# Patient Record
Sex: Male | Born: 2000
Health system: Southern US, Community
[De-identification: ages and names within clinical notes are randomized; demographics above are authoritative.]

---
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
## 2024-04-16 ENCOUNTER — Encounter: Payer: Self-pay | Admitting: Nurse Practitioner

## 2024-04-16 ENCOUNTER — Ambulatory Visit: Payer: Self-pay | Admitting: Nurse Practitioner

## 2024-04-16 VITALS — BP 118/70 | HR 46 | Temp 98.4°F | Ht 72.0 in | Wt 187.0 lb

## 2024-04-16 DIAGNOSIS — H5213 Myopia, bilateral: Secondary | ICD-10-CM | POA: Insufficient documentation

## 2024-04-16 DIAGNOSIS — F32A Depression, unspecified: Secondary | ICD-10-CM | POA: Insufficient documentation

## 2024-04-16 DIAGNOSIS — Z139 Encounter for screening, unspecified: Secondary | ICD-10-CM

## 2024-04-16 DIAGNOSIS — Z7689 Persons encountering health services in other specified circumstances: Secondary | ICD-10-CM

## 2024-04-16 DIAGNOSIS — Z2821 Immunization not carried out because of patient refusal: Secondary | ICD-10-CM | POA: Insufficient documentation

## 2024-04-16 DIAGNOSIS — R6882 Decreased libido: Secondary | ICD-10-CM | POA: Insufficient documentation

## 2024-04-16 DIAGNOSIS — H40053 Ocular hypertension, bilateral: Secondary | ICD-10-CM | POA: Insufficient documentation

## 2024-04-16 DIAGNOSIS — R001 Bradycardia, unspecified: Secondary | ICD-10-CM

## 2024-04-16 DIAGNOSIS — F419 Anxiety disorder, unspecified: Secondary | ICD-10-CM

## 2024-04-16 NOTE — Progress Notes (Addendum)
 LILLETTE Kristeen JINNY Gladis, CMA,acting as a neurosurgeon for Gaines Ada, FNP.,have documented all relevant documentation on the behalf of Gaines Ada, FNP,as directed by  Gaines Ada, FNP while in the presence of Gaines Ada, FNP.  Subjective:  Patient ID: Corey Torres , male    DOB: 01/10/2001 , 23 y.o.   MRN: [id number]  Chief Complaint  Patient presents with   Establish Care    Patient presents today to establish care, Patient reports compliance with medication. Patient denies any chest pain, SOB, or headaches. Patient would like his testosterone  checked, patient reports for the past 10 months he has had low libido. 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  Patient has been married 4 years, he does have a baby on the way.    Anxiety    Patient reports he feels like he struggles with anxiety, he reports he was previously doing better help but got to expensive. He is open to seeing a therapist he prefers video calls.     HPI Discussed the use of AI scribe software for clinical note transcription with the patient, who gave verbal consent to proceed.  History of Present Illness Corey Torres is a 23 year old male who presents with decreased libido.  He has experienced a progressive decline in libido over the past year, with a decrease in sexual activity from three times a week to once a month. He lacks sexual urge and interest, which he finds concerning. 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 No erectile dysfunction.  He has a history of anxiety, for which he attended therapy sessions post-deployment but discontinued due to cost. He has not taken any medications for anxiety. He reports a history of exposure to pornography from a young age, which he believes may have impacted his sexual function. He has been attempting to reduce his consumption of pornography over the past five  years.  His past medical history includes elevated blood pressure during his late teens and early twenties, and a diagnosis of ocular hypertension about a year ago. He reports a family history of high blood pressure and bipolar disorder in his father, and bipolar disorder and addiction issues in his sister.  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 He has reduced his alcohol consumption significantly over the past year, now drinking about five beers a week. He recently got out of the [employer] 3 weeks ago. He is planning to use the [facility] in the future for some of his disability claims. He is married. He has one child on the way. He denies smoking. No dizziness or lightheadedness.     History reviewed. No pertinent past medical history.   Family History  Problem Relation Age of Onset   Hypertension Father    Diabetes Maternal Aunt    Hypertension Paternal Grandfather     No current outpatient medications on file.   Allergies  Allergen Reactions   Penicillins Anaphylaxis   Penicillins      Review of Systems  Constitutional: Negative.   HENT: Negative.    Eyes: Negative.   Respiratory: Negative.    Cardiovascular: Negative.   Gastrointestinal: Negative.   Endocrine: Negative.   Genitourinary: Negative.   Musculoskeletal: Negative.   Allergic/Immunologic: Negative.   Neurological: Negative.   Hematological: Negative.   Psychiatric/Behavioral: Negative.       Today's Vitals   04/16/24 9070  BP: 118/70  Pulse: (!) 46  Temp: 98.4 F (36.9 C)  TempSrc: Oral  Weight: 187 lb (84.8 kg)  Height: 6' (1.829 m)  PainSc: 0-No pain   Body mass index is 25.36 kg/m.  Wt Readings from Last 3 Encounters:  04/16/24 187 lb (84.8 kg)     Objective:  Physical Exam Vitals and nursing note reviewed.  Constitutional:      General: He is not in acute distress.    Appearance: Normal appearance. He is normal weight.  HENT:     Head: Normocephalic.   Cardiovascular:     Rate and Rhythm: Normal rate and regular rhythm.     Pulses: Normal pulses.     Heart sounds: Normal heart sounds. No murmur heard. Pulmonary:     Effort: Pulmonary effort is normal. No respiratory distress.     Breath sounds: Normal breath sounds. No wheezing.  Musculoskeletal:        General: Normal range of motion.  Skin:    General: Skin is warm and dry.     Capillary Refill: Capillary refill takes less than 2 seconds.  Neurological:     General: No focal deficit present.     Mental Status: He is alert and oriented to person, place, and time.  Psychiatric:        Mood and Affect: Mood normal.        Behavior: Behavior normal.        Thought Content: Thought content normal.        Judgment: Judgment normal.        04/16/2024   10:02 AM  GAD 7 : Generalized Anxiety Score  Nervous, Anxious, on Edge 1  Control/stop worrying 0  Worry too much - different things 1  Trouble relaxing 0  Restless 0  Easily annoyed or irritable 2  Afraid - awful might happen 0  Total GAD 7 Score 4  Anxiety Difficulty Somewhat difficult       04/16/2024   10:02 AM  Depression screen PHQ 2/9  Decreased Interest 0  Down, Depressed, Hopeless 0  PHQ - 2 Score 0  Altered sleeping 1  Tired, decreased energy 1  Change in appetite 0  Feeling bad or failure about yourself  0  Trouble concentrating 2  Moving slowly or fidgety/restless 1  Suicidal thoughts 0  PHQ-9 Score 5  Difficult doing work/chores Not difficult at all    Assessment And Plan:  Establishing care with new doctor, encounter for  Low libido Assessment & Plan: Progressive decline likely due to stress and depression post-deployment. Consider hormonal imbalances, thyroid  dysfunction, or psychological factors. Testosterone  therapy risks discussed due to potential infertility. - Order testosterone  level. - Order TSH. - Refer to integrative behavioral health for therapy and counseling.  Orders: -      Testosterone ,Free and Total -     TSH -     CMP14+EGFR -     CBC -     Amb ref to [facility]  COVID-19 vaccination declined  Encounter for screening -     Hepatitis C antibody -     Hepatitis B surface antibody,qualitative  Anxiety and depression Assessment & Plan: Mild symptoms, previously discontinued therapy due to cost. Therapy emphasized over medication to address underlying issues, including potential pornography addiction. - Refer to integrative behavioral health for therapy and counseling. - anxiety score is 4 and depression score 5   Orders: -     Amb ref to [facility]  Bilateral ocular hypertension  Bradycardia Assessment & Plan: Heart rate at 46 bpm, possibly normal for athletic individuals. No dizziness or lightheadedness reported. - Order EKG Sinus bradycardia at 43. - Advise to seek emergency care if experiencing dizziness, lightheadedness, or feeling faint.  Orders: -     EKG 12-Lead     Return in about 5 months (around 09/16/2024) for phy when able.  Patient was given opportunity to ask questions. Patient verbalized understanding of the plan and was able to repeat key elements of the plan. All questions were answered to their satisfaction.   Patient and/or legal guardian verbally consented to [facility] services about presenting concerns and psychiatric consultation as appropriate.  The services will be billed as appropriate for the patient   I, Gaines Ada, FNP, have reviewed all documentation for this visit. The documentation on 04/16/24 for the exam, diagnosis, procedures, and orders are all accurate and complete.   IF YOU HAVE BEEN REFERRED TO A SPECIALIST, IT MAY TAKE 1-2 WEEKS TO SCHEDULE/PROCESS THE REFERRAL. IF YOU HAVE NOT HEARD FROM US /SPECIALIST IN TWO WEEKS, PLEASE GIVE US  A CALL AT [phone].

## 2024-04-22 LAB — TESTOSTERONE,FREE AND TOTAL
Testosterone, Free: 14.4 pg/mL (ref 9.3–26.5)
Testosterone: 1005 ng/dL — ABNORMAL HIGH (ref 264–916)

## 2024-04-22 LAB — HEPATITIS C ANTIBODY: Hep C Virus Ab: NONREACTIVE

## 2024-04-22 LAB — CBC
Hematocrit: 43.9 % (ref 37.5–51.0)
Hemoglobin: 14.3 g/dL (ref 13.0–17.7)
MCH: 29.4 pg (ref 26.6–33.0)
MCHC: 32.6 g/dL (ref 31.5–35.7)
MCV: 90 fL (ref 79–97)
Platelets: 222 x10E3/uL (ref 150–450)
RBC: 4.86 x10E6/uL (ref 4.14–5.80)
RDW: 12.4 % (ref 11.6–15.4)
WBC: 5.2 x10E3/uL (ref 3.4–10.8)

## 2024-04-22 LAB — CMP14+EGFR
ALT: 30 IU/L (ref 0–44)
AST: 26 IU/L (ref 0–40)
Albumin: 5 g/dL (ref 4.3–5.2)
Alkaline Phosphatase: 83 IU/L (ref 44–121)
BUN/Creatinine Ratio: 15 (ref 9–20)
BUN: 16 mg/dL (ref 6–20)
Bilirubin Total: 0.7 mg/dL (ref 0.0–1.2)
CO2: 21 mmol/L (ref 20–29)
Calcium: 9.9 mg/dL (ref 8.7–10.2)
Chloride: 104 mmol/L (ref 96–106)
Creatinine, Ser: 1.07 mg/dL (ref 0.76–1.27)
Globulin, Total: 2.3 g/dL (ref 1.5–4.5)
Glucose: 79 mg/dL (ref 70–99)
Potassium: 4.6 mmol/L (ref 3.5–5.2)
Sodium: 141 mmol/L (ref 134–144)
Total Protein: 7.3 g/dL (ref 6.0–8.5)
eGFR: 100 mL/min/1.73 (ref >59)

## 2024-04-22 LAB — TSH: TSH: 1.13 u[IU]/mL (ref 0.450–4.500)

## 2024-04-22 LAB — HEPATITIS B SURFACE ANTIBODY,QUALITATIVE: Hep B Surface Ab, Qual: REACTIVE

## 2024-04-27 ENCOUNTER — Encounter: Payer: Self-pay | Admitting: Nurse Practitioner

## 2024-04-27 ENCOUNTER — Ambulatory Visit: Payer: Self-pay | Admitting: Nurse Practitioner

## 2024-04-27 DIAGNOSIS — R001 Bradycardia, unspecified: Secondary | ICD-10-CM | POA: Insufficient documentation

## 2024-04-27 NOTE — Assessment & Plan Note (Signed)
 Mild symptoms, previously discontinued therapy due to cost. Therapy emphasized over medication to address underlying issues, including potential pornography addiction. - Refer to integrative behavioral health for therapy and counseling. - anxiety score is 4 and depression score 5

## 2024-04-27 NOTE — Assessment & Plan Note (Addendum)
 Heart rate at 46 bpm, possibly normal for athletic individuals. No dizziness or lightheadedness reported. - Order EKG Sinus bradycardia at 43. - Advise to seek emergency care if experiencing dizziness, lightheadedness, or feeling faint.

## 2024-04-27 NOTE — Assessment & Plan Note (Signed)
 Progressive decline likely due to stress and depression post-deployment. Consider hormonal imbalances, thyroid dysfunction, or psychological factors. Testosterone  therapy risks discussed due to potential infertility. - Order testosterone  level. - Order TSH. - Refer to integrative behavioral health for therapy and counseling.

## 2024-05-28 ENCOUNTER — Ambulatory Visit: Payer: Self-pay | Admitting: Licensed Clinical Social Worker

## 2024-05-28 DIAGNOSIS — F4323 Adjustment disorder with mixed anxiety and depressed mood: Secondary | ICD-10-CM
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
## 2024-05-28 NOTE — Patient Instructions (Addendum)
 Using Behavioral Activation to manage stress/depression symptoms    Identify/understand your own mood triggers.   Structure your day--get up around the same time, eat meals/snacks around the same time, go to bed around the same time.   Purposefully schedule self care time and time to complete tasks. This can include quiet time  Stimulate your brain--go for a walk, text/call a friend or family member, if you are indoors--go outside (and vice versa), go for a drive, go to a store with bright colors and bright lights. Try to do things in a different way--drive to your favorite places using an alternative route, or instead of starting on the right side of the grocery store when shopping, start on the left side. You might feel a bit uncomfortable doing things outside of the comfort zone, but this is helping the brain create new neural pathways and is very healthy for brain/emotional health.   Physical movement based on your ability. If you can go for a walk, do stretches, even waving your hands to music can trigger feel-good endorphins in the brain and help release physical tension we all hold in our bodies.  Even 5 minutes can make a difference.   Be intentional about doing things that bring you joy (or used to bring you joy), and look for the things in every day that make you happy.  Seek those glimmers of joy each day.  Set a timer for 5 minutes for a harder task (ex. Laundry, washing dishes).  Allow yourself to work distraction-free for 5 minutes, then stop when the timer goes off. If you need a break, take a break. If you want to continue working then set another timer for whatever time you choose.   Limit or eliminate substance use including alcohol, marijuana, or recreational use of prescription medication.  Let in the light!! Open the window blinds, curtains and let natural light in. Even sitting near a window or sitting outside can boost your mood, especially in the wintertime when there  is less daylight.    Things to envision for ourselves to to improve inspiration, motivation, and initiative :  improving physical wellness, focus on family relationships, focusing on our own mental/emotional well being, being a part of a bigger community, finding a hobby, being a part of something that fosters personal growth, engaging socially with others (even digitally!!!)    ANXIETY/PANIC EPISODE MANAGEMENT (CBT/MINDFULNESS BASED)   If you are in a highly stimulating or triggering environment, change your location to a less stimulating or safer environment .   Stimulate your senses by tasting/eating a sour candy (such as a lemon drop) or a strong flavored cough drop.  This triggers smell, taste, and touch since we  have had lots of nerve endings inside of your mouth.   Practice slow, controlled breathing to avoid hyperventilation.  Breathing into your nose for the count of 4 inside your head, holding your breath for the count of 4 inside your head, and exhale slowly counting to 8 inside your head.  This is called 4-4-8 breathing, or triangle breathing. (Controlled breathing). This helps manage panic, anxiety, anger, and tearfulness.  Additional grounding exercises include rubbing her hands softly together, and wiggling your toes inside your shoes, pretending to grasp the floor with your toes.    Listen to calming music or sounds  Count backwards in your head by twos or tens or recite multiplication tables in your head.  Visualize a calming, happy place and identify 5 explicit details about  this place (color, temperature, smells, visual details, etc.)  Splash cold water on your face or hold an ice cube in your hand (alternate hands)  Clench and release muscle groups (hands, shoulders, facial muscles)  Engage in soothing activities to recover after a stressful/anxious episode such as: drinking a warm beverage, sitting in your favorite comfortable location, positive physical contact  with a pet or weighted blanket, using positive self talk/positive affirmations.   Download PTSD Coach app for your phone or tablet--its free and has lots of tips to help you manage panic episodes no matter where you are!     Emergency Resources:  National Suicide & Crisis Lifeline: Call or text 988  Crisis Text Line: Text HOME to 301 760 5233  [facility]  [address] [place], [place] [address] [phone] or [phone] [facility] 24/7 FOR ANYONE [address] [place], [place]  [phone] Fax: [phone] [email or web address] *Interpreters available *Accepts all insurance and uninsured for Urgent Care needs *Accepts Medicaid and uninsured for outpatient treatment (below)       Outpatient Psychiatry and Counseling Services  FOR CRISIS:  call 988, 911, [facility] Urgent Care--[address] (WALK IN-- ALL COUNTIES OF RESIDENCE), or go to your local EMERGENCY DEPARTMENT   [facility] Address: [address], [place], [place] [address] Phone: [phone]  [facility] [place] Locations) [email or web address]  [facility] [address] [place], [place] [address] [phone]  [facility]  [address]  [place], [place] [address] [phone]  [facility] Outpatient Services/ Intensive Outpatient Therapy Program/CDIOP/PHP [address] [place], [place] [address] [phone]  [facility]                  Crisis Services (all counties) Outpatient Therapy Services ([place] only), Walk in [place] (Crisis all counties)      [phone]     [address]    [place], [place] [address]                 [facility]   [facility] [phone]. [address] [place], [place] [address]  [facility]       [address] [place]  [place], [place] [address]       [phone]  [facility] [address] [place], [place] [address] [phone]  [facility]    [address]    [place], [place] [address]     [phone]       [facility] [address] [place] [address]  [facility]     [address] [place], [place]      [phone]   Rosezena Jewels, [facility] Specialties: Trauma-focused CBT, adolescent and family therapy Languages: Spanish & English Contact: [phone]  [facility] Services: PHP, IOP, evening programs Address: [phone] Phone: [phone]      [facility] Rocky Boronda, MD [address] [place], [place] [address] [phone]  [facility]     [address]     [place], [place] [address]     [phone]   [facility] [place] Services: Therapy, psychiatry, coaching Address: [address] [place],  [address] Phone: [phone]      [facility] [address] [place], [place] [address] [phone]

## 2024-06-02 DIAGNOSIS — Z1379 Encounter for other screening for genetic and chromosomal anomalies: Secondary | ICD-10-CM | POA: Diagnosis not present

## 2024-06-10 ENCOUNTER — Encounter: Payer: Self-pay | Admitting: Licensed Clinical Social Worker

## 2024-06-10 ENCOUNTER — Telehealth: Payer: Self-pay | Admitting: Licensed Clinical Social Worker

## 2024-06-10 DIAGNOSIS — F4323 Adjustment disorder with mixed anxiety and depressed mood: Secondary | ICD-10-CM

## 2024-06-10 NOTE — BH Specialist Note (Signed)
 Disregard this session--incorrect context

## 2024-06-10 NOTE — BH Specialist Note (Signed)
 Attestation signed by Corey Torres, PMHNP, DNP 06/10/2024 10:32 AM   Collaborative Care Psychiatric Consultant Case Review   Assessment/Provisional Diagnosis 23 year old male with history of eye issues, bradycardia, anxiety, and depression. The patient is referred for anxiety and sexual dysfunction.   Provisional Diagnosis: # MDD, Recurrent, mild.     Recommendation 1. Referral to [facility] outpatient psychiatric care for ADHD testing and medication management  2. BH specialist to follow up.   Thank you for your consult. Please contact our collaborative care team for any questions or concerns.   I spent 20 minutes chart reviewing, discussing with [facility] Speicalist and documenting in the chart.   The above treatment considerations and suggestions are based on consultation with the [facility] specialist and/or PCP and a review of information available in the shared registry and the patient's Electronic Health Record (EHR). I have not personally examined the patient. All recommendations should be implemented with consideration of the patient's relevant prior history and current clinical status. Please feel free to call me with any questions about the care of this patient.   Virtual Behavioral Health Treatment Plan Team Note  MRN: [id number] NAME: Corey Torres  DATE: 06/10/24  Start time: Start Time: 1115 End time: Stop Time: 1130 Total time: Total Time in Minutes (Visit): 15  Total number of Virtual BH Treatment Team Plan encounters: 1/4  Treatment Team Attendees: Corey Mierzwa, LCSW and Corey Becker, DNP   Diagnoses:    ICD-10-CM   1. Adjustment disorder with mixed anxiety and depressed mood  F43.23       Goals, Interventions and Follow-up Plan Goals: Increase healthy adjustment to current life circumstances Interventions: Solution-Focused Strategies Link to [facility]  Medication Management Recommendations: Referral to [place] outpatient psychiatric care for  ADHD testing and medication management ([facility])  Follow-up Plan: Refer to [facility] Outpatient Therapy [facility] Collaborative Care Management including short term counseling support and psychiatric medication consultation  History of the present illness Presenting Problem/Current Symptoms: Corey Torres is a 23 y.o. male with history of anxiety and sexual dysfunction seen in consultation at the request of Corey Torres for establishment of [facility] management.   Pt is currently taking the following psychiatric medications: none .  Current symptoms include: trouble focusing/concentrating, low energy, low motivation, sexual dysfunction, and some insomnia due to his night classes.  Pt denies SI, HI, or AVH at time of session. Pt reports drinking 5-6 ETOH beverages 2-3 times per week. Pt denies additional substance use.   Psychiatric History   Have you ever been treated for a mental health problem? Yes If Yes, when were you treated and whom did you see (psychiatrist/counselor) ? When?  In the past few months.   Name of provider: Counselor on [facility]   Psychiatric History  Depression: Yes--on and off. Pt feels mild. Anxiety: Yes--on and off since childhood. Social events, public speaking. Mania: No Psychosis: No PTSD symptoms: No   Past Psychiatric History/Hospitalization(s): Hospitalization for psychiatric illness: No Prior Suicide Attempts: No Prior Self-injurious behavior: No Psychosocial stressors Flowsheet Row Integrated Behavioral Health from 05/28/2024 in [facility]  Current Stressors Birth of a child, Housing/homelessness, Family conflict, Sexuality, Work environment  [upcoming birth of a child, recently transitioned out of the [employer] to [employer] life, purchased new home, started new job at Warden/ranger, started taking EMT classes---all in the past 2 months.]  Familial Stressors None  Sleep Difficulty falling asleep  Appetite No  problems  Coping  ability Normal  Patient taking medications as prescribed No prescribed medications, Other (Comment)  [Pt reports that he took Adderall in the military and would like to try it again]    Self-harm Behaviors Risk Assessment Flowsheet Row Integrated Behavioral Health from 05/28/2024 in [facility]  Self-harm risk factors Social withdrawal/isolation  Have you recently had any thoughts about harming yourself? No    Screenings PHQ-9 Assessments:     05/28/2024   10:32 AM 04/16/2024   10:02 AM  Depression screen PHQ 2/9  Decreased Interest 1 0  Down, Depressed, Hopeless 0 0  PHQ - 2 Score 1 0  Altered sleeping 2 1  Tired, decreased energy 3 1  Change in appetite 0 0  Feeling bad or failure about yourself  0 0  Trouble concentrating 1 2  Moving slowly or fidgety/restless 0 1  Suicidal thoughts 0 0  PHQ-9 Score 7 5  Difficult doing work/chores  Not difficult at all   GAD-7 Assessments:     05/28/2024   10:33 AM 04/16/2024   10:02 AM  GAD 7 : Generalized Anxiety Score  Nervous, Anxious, on Edge 1 1  Control/stop worrying 0 0  Worry too much - different things 0 1  Trouble relaxing 0 0  Restless 0 0  Easily annoyed or irritable 1 2  Afraid - awful might happen 0 0  Total GAD 7 Score 2 4  Anxiety Difficulty Somewhat difficult Somewhat difficult    Past Medical History No past medical history on file.  Vital signs: There were no vitals filed for this visit.  Allergies:  Allergies as of 06/10/2024 - Review Complete 04/27/2024  Allergen Reaction Noted   Penicillins Anaphylaxis 04/16/2024   Penicillins  07/02/2018    Medication History Current medications:  No outpatient encounter medications on file as of 06/10/2024.   No facility-administered encounter medications on file as of 06/10/2024.     Scribe for Treatment Team: Corey Torres Corey Delsin Copen, LCSW

## 2024-06-11 ENCOUNTER — Ambulatory Visit (INDEPENDENT_AMBULATORY_CARE_PROVIDER_SITE_OTHER): Payer: Self-pay | Admitting: Licensed Clinical Social Worker

## 2024-06-11 DIAGNOSIS — F4323 Adjustment disorder with mixed anxiety and depressed mood: Secondary | ICD-10-CM | POA: Diagnosis not present

## 2024-06-11 NOTE — BH Specialist Note (Signed)
 Integrated Behavioral Health Follow Up In-Person Visit  06/11/24   MRN: [id number] Name: Corey Torres  Number of Integrated Behavioral Health Clinician visits: 3- Third Visit  Session Start time: [phone]   Session End time: 1030  Total time in minutes: 35    Types of Service: Individual psychotherapy and Collaborative care  Subjective: Armen Waring is a 23 y.o. male accompanied by self  Mahesh Sizemore is a 23 y.o. male with history of anxiety and sexual dysfunction seen in consultation at the request of Gaines Ada FNP for establishment of [facility] management. Pt is currently taking the following psychiatric medications: none . Current symptoms include: trouble focusing/concentrating, low energy, low motivation, sexual dysfunction, and some insomnia due to his night classes. Pt denies SI, HI, or AVH at time of session. Pt reports drinking 5-6 ETOH beverages 2-3 times per week. Pt denies additional substance use.   Duration of problem: ongoing; Severity of problem: moderate  Objective: Mood: Anxious and Depressed and Affect: Appropriate Risk of harm to self or others: No plan to harm self or others  Life Context: Family and Social: Pt reports stable relationship with wife. Pt feels he is disappointing his wife at times due to their current lack of intimacy.   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 Pt admits that there is an individual he feels attracted to in the class and is conflicted about these newer feelings.   Self-Care: Pt reports that he is trying to cut back with his side jobs to allow more free time to spend with family and working around the house.   Life Changes: Pt reports that he has been late to work a few times and was recently written up because of this. Pt reports that he is intentionally showing up for work one hour early on most days.  Patient reports that he was approved for [employer] disability, so he will  be receiving his benefits in around 2 weeks which will improve his overall financial situation significantly.  Patient and/or Family's Strengths/Protective Factors: Social and Emotional competence, Concrete supports in place (healthy food, safe environments, etc.), and Physical Health (exercise, healthy diet, medication compliance, etc.)  Goals Addressed: Patient will:  Reduce symptoms of: anxiety, depression, obsessions, and stress   Increase knowledge and/or ability of: coping skills, healthy habits, self-management skills, and stress reduction   Demonstrate ability to: Increase healthy adjustment to current life circumstances and Increase adequate support systems for patient/family  Progress towards Goals: Ongoing  Interventions: Interventions utilized:  Behavioral Activation and CBT Cognitive Behavioral Therapy Standardized Assessments completed: Programmer, multimedia Row Integrated Behavioral Health from 06/11/2024 in [facility] Integrated Behavioral Health from 05/28/2024 in [facility]  C-SSRS RISK CATEGORY No Risk No Risk      Patient and/or Family Response: Pt reports wife is supportive--she wants him to sacrifice things throughout her pregnancy as she has (nicotine)  Patient Centered Plan: Patient is on the following Treatment Plan(s):  Referral to [facility] outpatient psychiatric care for ADHD testing and medication management   Clinical Assessment/Diagnosis  Adjustment disorder with mixed anxiety and depressed mood    Assessment: Patient currently experiencing trouble focusing/concentrating, low energy, low motivation, feeling tired, obsession preoccupation w/ porn..   Patient may benefit from [facility]-based mental helath services including assessment/evaluation for ADHD and outpatient therapy .  Plan:  Follow up with behavioral health clinician on : 06/25/24 Behavioral  recommendations:  behavioral activation, CBT: identify triggers/delay gratification/pairing Referral(s): Paramedic (LME/Outside Clinic)  Matty Deamer R [place] Sorn, LCSW

## 2024-06-11 NOTE — Patient Instructions (Addendum)
 [facility] Info  Address: [address] [place], [place] [address]  Main Phone: Phone numbers Main phone: [phone] [facility] health connect: [phone] Mental health care: [phone], ext. [phone]  Mental Health Care Hours: Monday-Friday: 8:00 a.m. - 4:30 p.m. Saturday: 7:00 a.m. - 4:30 p.m. Sunday: Closed   Mental Health Services Offered [facility] provides both outpatient and inpatient care for a wide range of conditions, including:  Depression, anxiety, grief PTSD and combat stress Substance use disorders Relationship and emotional issues Anger management Vocational assistance Cognitive and memory issues Services for homeless Veterans Residential rehabilitation for addiction Transition support for returning Veterans (OIF/OEF/OND)  These services are confidential, and information is only shared with written consent, except in specific legal circumstances. [va.gov]  Getting Started If you're new to VA mental health services:  Already enrolled in [place] [facility] care? Ask your primary care provider for a referral. Not enrolled? You can still access some services. Visit or call the clinic directly. Emergency help: Call the [facility] at 988, then press 1, or text [phone]. [va.gov]    ___________________________________________________________________   Using Behavioral Activation to manage stress/depression symptoms    Identify/understand your own mood triggers.   Structure your day--get up around the same time, eat meals/snacks around the same time, go to bed around the same time.   Purposefully schedule self care time and time to complete tasks. This can include quiet time  Stimulate your brain--go for a walk, text/call a friend or family member, if you are indoors--go outside (and vice versa), go for a drive, go to a store with bright colors and bright lights. Try to do  things in a different way--drive to your favorite places using an alternative route, or instead of starting on the right side of the grocery store when shopping, start on the left side. You might feel a bit uncomfortable doing things outside of the comfort zone, but this is helping the brain create new neural pathways and is very healthy for brain/emotional health.   Physical movement based on your ability. If you can go for a walk, do stretches, even waving your hands to music can trigger feel-good endorphins in the brain and help release physical tension we all hold in our bodies.  Even 5 minutes can make a difference.   Be intentional about doing things that bring you joy (or used to bring you joy), and look for the things in every day that make you happy.  Seek those glimmers of joy each day.  Set a timer for 5 minutes for a harder task (ex. Laundry, washing dishes).  Allow yourself to work distraction-free for 5 minutes, then stop when the timer goes off. If you need a break, take a break. If you want to continue working then set another timer for whatever time you choose.   Limit or eliminate substance use including alcohol, marijuana, or recreational use of prescription medication.  Let in the light!! Open the window blinds, curtains and let natural light in. Even sitting near a window or sitting outside can boost your mood, especially in the wintertime when there is less daylight.    Things to envision for ourselves to to improve inspiration, motivation, and initiative :  improving physical wellness, focus on family relationships, focusing on our own mental/emotional well being, being a part of a bigger community, finding a hobby, being a part of something that fosters personal growth, engaging socially with others (even digitally!!!)  Emergency Resources:  National Suicide & Crisis Lifeline: Call or text 988  Crisis Text Line: Text HOME to 216-331-5857  [facility]  [address] [place], [place] [address] [phone] or [phone] [facility] 24/7 FOR ANYONE [address], [place], [place]  [phone] Fax: [phone] [email or web address] *Interpreters available *Accepts all insurance and uninsured for Urgent Care needs *Accepts Medicaid and uninsured for outpatient treatment (below)

## 2024-06-24 ENCOUNTER — Telehealth: Payer: Self-pay | Admitting: Licensed Clinical Social Worker

## 2024-06-24 NOTE — Telephone Encounter (Addendum)
 IBH clinician returned pt phone call requesting info about VA referral. Informed pt that he could call the [place] and schedule his own appointment and if they need referral from PCP then they could.  IBH clinician sent PCP message to send referral through to [facility] for pt.  At time of call, pt does not have MyChart set up, so IBH clinician is unable to send messages via MyChart. Information re: [place] [facility] is as follows:  [facility] Info  Address: [address] [place], [place] [address]  Main Phone: Phone numbers Main phone: [phone] VA health connect: [phone] Mental health care: [phone], ext. [phone]  Mental Health Care Hours: Monday-Friday: 8:00 a.m. - 4:30 p.m. Saturday: 7:00 a.m. - 4:30 p.m. Sunday: Closed     *pt elected to cancel [facility] Col[facility]ative care appointment since he is following up with VA referral.

## 2024-06-25 ENCOUNTER — Ambulatory Visit: Payer: Self-pay | Admitting: Licensed Clinical Social Worker

## 2024-07-06 ENCOUNTER — Other Ambulatory Visit: Payer: Self-pay | Admitting: Nurse Practitioner

## 2024-07-06 DIAGNOSIS — F419 Anxiety disorder, unspecified: Secondary | ICD-10-CM
